# Patient Record
Sex: Male | Born: 1985 | Race: Black or African American | Hispanic: No | Marital: Single
Health system: Southern US, Community
[De-identification: ages and names within clinical notes are randomized; demographics above are authoritative.]

---

## 2019-01-06 ENCOUNTER — Other Ambulatory Visit: Payer: Self-pay

## 2019-01-06 DIAGNOSIS — Z20822 Contact with and (suspected) exposure to covid-19: Secondary | ICD-10-CM

## 2019-01-09 LAB — NOVEL CORONAVIRUS, NAA: SARS-CoV-2, NAA: NOT DETECTED

## 2020-06-12 ENCOUNTER — Emergency Department (HOSPITAL_COMMUNITY)
Admission: EM | Admit: 2020-06-12 | Discharge: 2020-06-12 | Disposition: A | Discharge status: Home / Self Care | Payer: Self-pay | Attending: Emergency Medicine | Admitting: Emergency Medicine

## 2020-06-12 ENCOUNTER — Emergency Department (HOSPITAL_COMMUNITY): Payer: Self-pay

## 2020-06-12 ENCOUNTER — Other Ambulatory Visit: Payer: Self-pay

## 2020-06-12 DIAGNOSIS — W1830XA Fall on same level, unspecified, initial encounter: Secondary | ICD-10-CM | POA: Insufficient documentation

## 2020-06-12 DIAGNOSIS — Z23 Encounter for immunization: Secondary | ICD-10-CM | POA: Insufficient documentation

## 2020-06-12 DIAGNOSIS — F10129 Alcohol abuse with intoxication, unspecified: Secondary | ICD-10-CM | POA: Insufficient documentation

## 2020-06-12 DIAGNOSIS — S0181XA Laceration without foreign body of other part of head, initial encounter: Secondary | ICD-10-CM | POA: Insufficient documentation

## 2020-06-12 DIAGNOSIS — F10920 Alcohol use, unspecified with intoxication, uncomplicated: Secondary | ICD-10-CM

## 2020-06-12 DIAGNOSIS — S161XXA Strain of muscle, fascia and tendon at neck level, initial encounter: Secondary | ICD-10-CM | POA: Insufficient documentation

## 2020-06-12 DIAGNOSIS — S060X1A Concussion with loss of consciousness of 30 minutes or less, initial encounter: Secondary | ICD-10-CM | POA: Insufficient documentation

## 2020-06-12 DIAGNOSIS — S0219XA Other fracture of base of skull, initial encounter for closed fracture: Secondary | ICD-10-CM

## 2020-06-12 DIAGNOSIS — S01512A Laceration without foreign body of oral cavity, initial encounter: Secondary | ICD-10-CM | POA: Insufficient documentation

## 2020-06-12 DIAGNOSIS — S0281XA Fracture of other specified skull and facial bones, right side, initial encounter for closed fracture: Secondary | ICD-10-CM | POA: Insufficient documentation

## 2020-06-12 MED ORDER — CIPROFLOXACIN-DEXAMETHASONE 0.3-0.1 % OT SUSP: OTIC | 0 refills | Status: AC

## 2020-06-12 MED ORDER — TETANUS-DIPHTH-ACELL PERTUSSIS 5-2.5-18.5 LF-MCG/0.5 IM SUSY
0.5000 mL | PREFILLED_SYRINGE | Freq: Once | INTRAMUSCULAR | Status: AC
  Administered 2020-06-12: 0.5 mL via INTRAMUSCULAR
  Filled 2020-06-12: qty 0.5

## 2020-06-12 MED ORDER — LIDOCAINE-EPINEPHRINE 1 %-1:100000 IJ SOLN
20.0000 mL | Freq: Once | INTRAMUSCULAR | Status: AC
  Administered 2020-06-12: 20 mL
  Filled 2020-06-12: qty 1
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
## 2020-06-12 NOTE — ED Notes (Signed)
Patient transported to CT 

## 2020-06-12 NOTE — ED Notes (Signed)
Pt refused temperature.

## 2020-06-12 NOTE — Discharge Instructions (Addendum)
Use the eardrops twice a day for 1 week.  Please see the ear specialist in 1 week.  You have a broken bone inside her ear which will cause some bleeding. If you have any fever over 100, worsening headache in the next 48 hours please return to the ER The stitches in your face will heal on their own and do not need to be removed
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
## 2020-06-12 NOTE — ED Notes (Signed)
Pt is now more awake and is oriented. Able to answer questions and hold a conversation. Reports living in [place] for a few months. Pt phone has no battery at this time and is charging. Pt is cleared by MD to go home. Cleared for discharge at this time.
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
## 2021-07-31 IMAGING — CT CT TEMPORAL BONES W/O CM
3 of 8 series · 15 of 40 positions shown, 17 images · non-contrast
Comparison: Head, face and cervical spine CT reported separately
today.

CLINICAL DATA: 34-year-old male status post fall, head and face
trauma. Possible bilateral temporal bone fractures on plain head CT.

EXAM:
CT TEMPORAL BONES WITHOUT CONTRAST
TECHNIQUE: Axial and coronal plane CT imaging of the petrous temporal bones was
performed with thin-collimation image reconstruction. No intravenous
contrast was administered. Multiplanar CT image reconstructions were
also generated.

[Series 4: temp bone bilat bone 0.6 uq77 · axial · 0.39mm/px · z∈[-162,-84]mm · 7 of 173 slices shown, 9 images]
[im 22/173  brain]
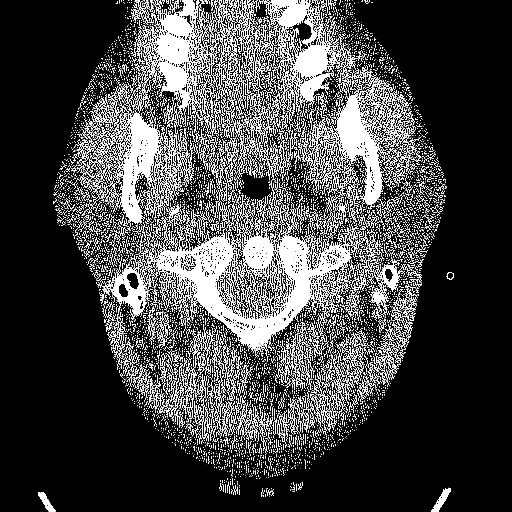
[im 22/173  bone]
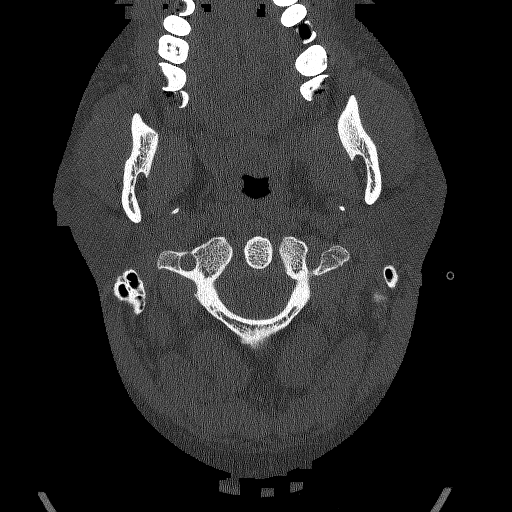
[im 44/173  bone]
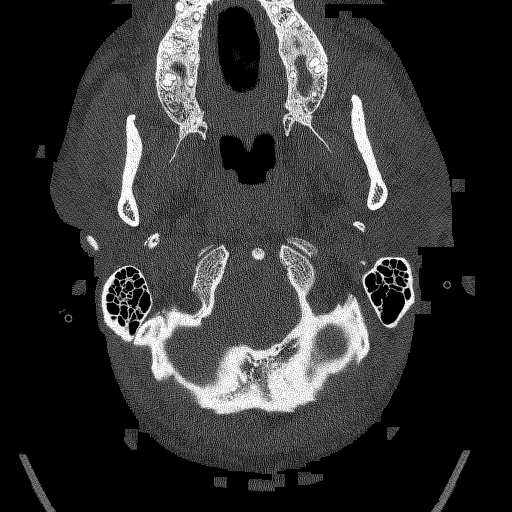
[im 65/173  bone]
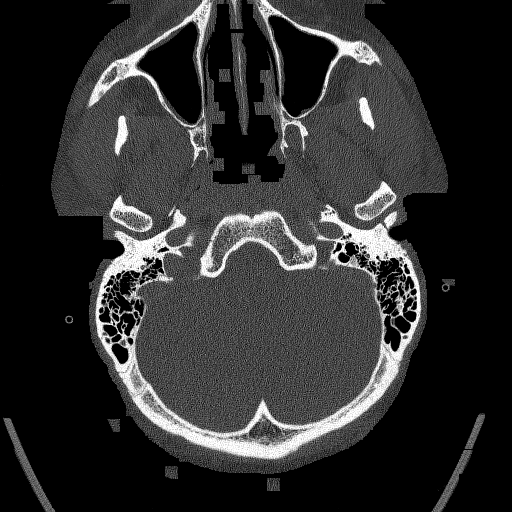
[im 87/173  bone]
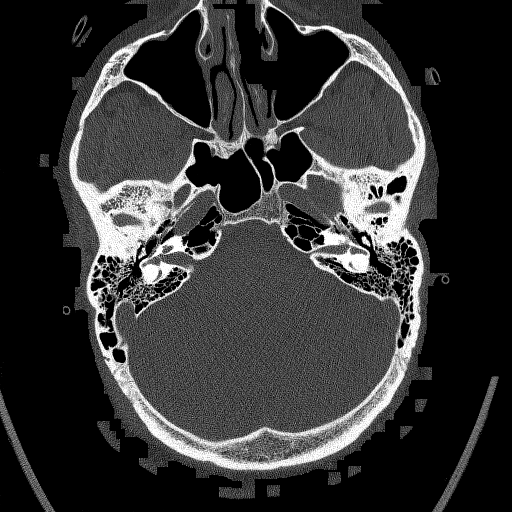
[im 108/173  brain]
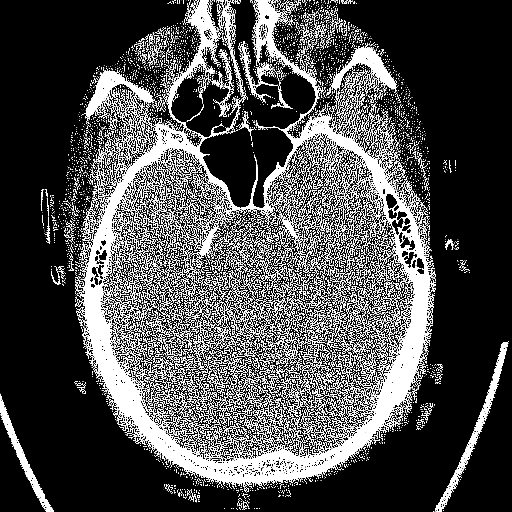
[im 108/173  bone]
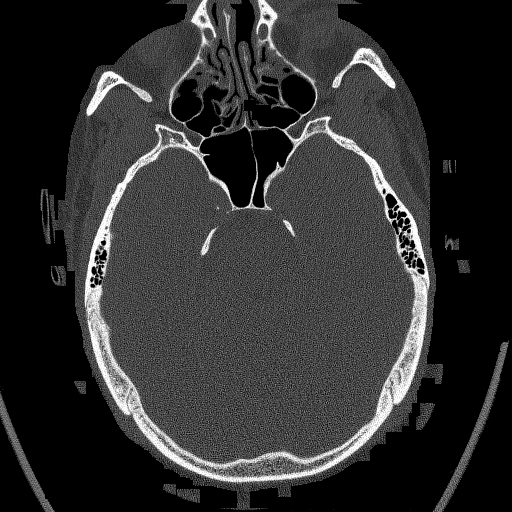
[im 130/173  bone]
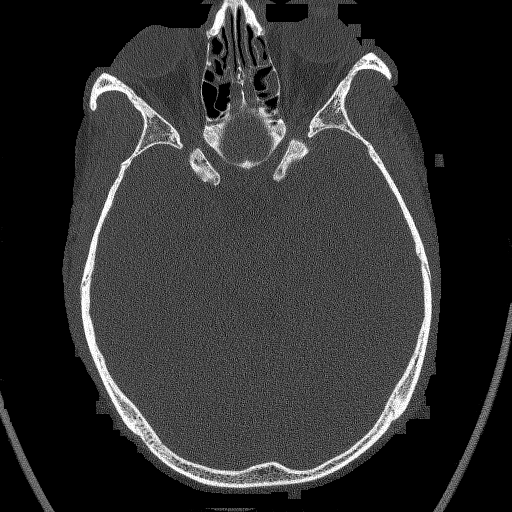
[im 151/173  bone]
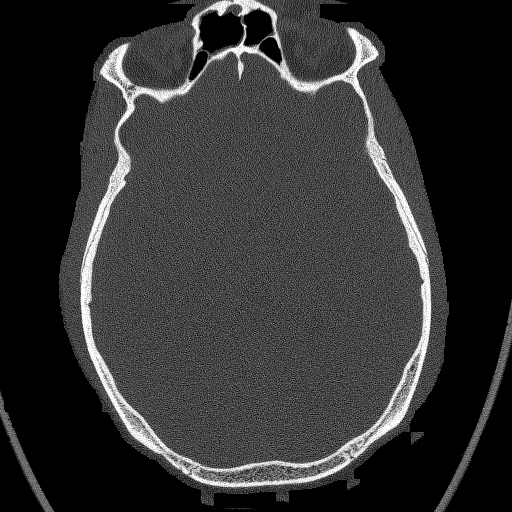

[Series 5: temp bone axial mag rt · axial · 0.28mm/px · z∈[-162,-84]mm · 7 of 173 slices shown]
[im 22/173  bone]
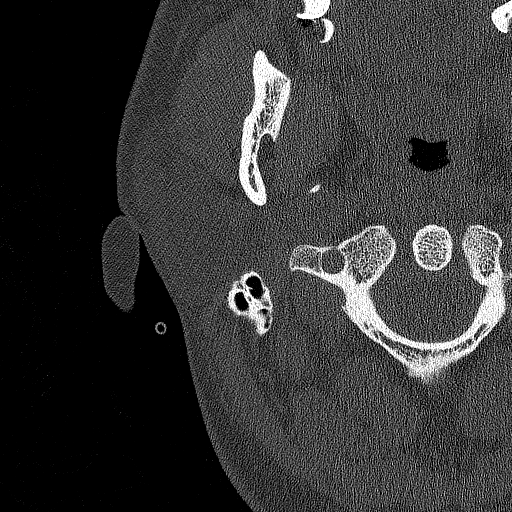
[im 44/173  bone]
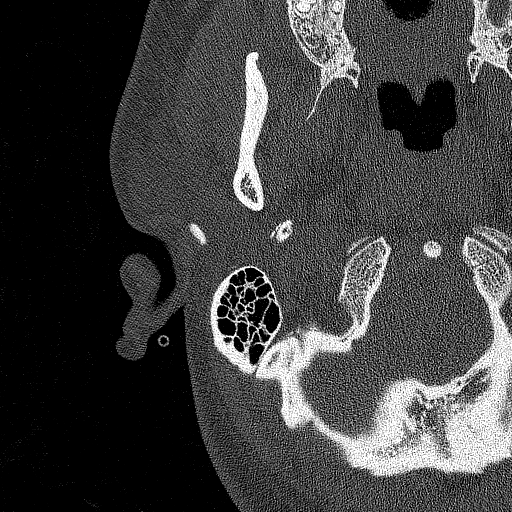
[im 65/173  bone]
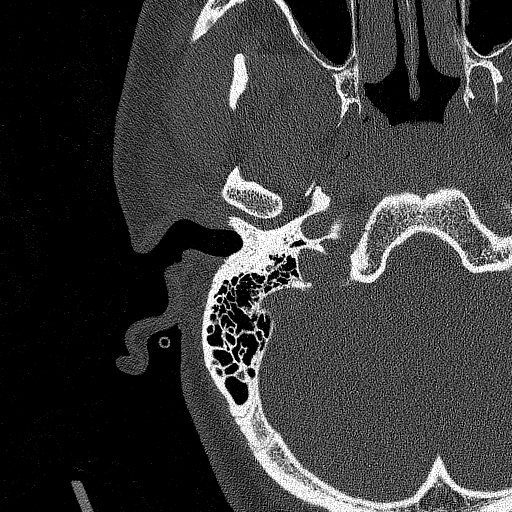
[im 87/173  bone]
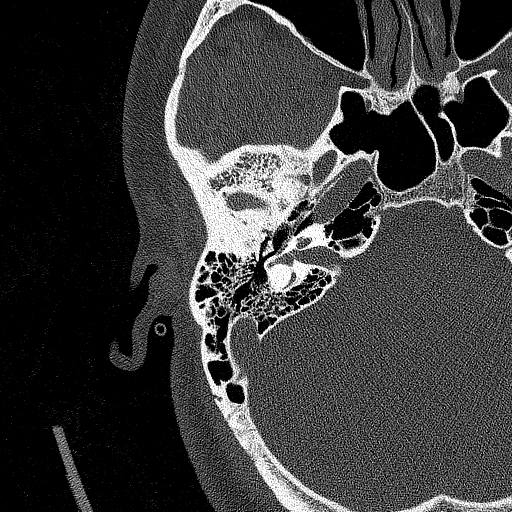
[im 108/173  bone]
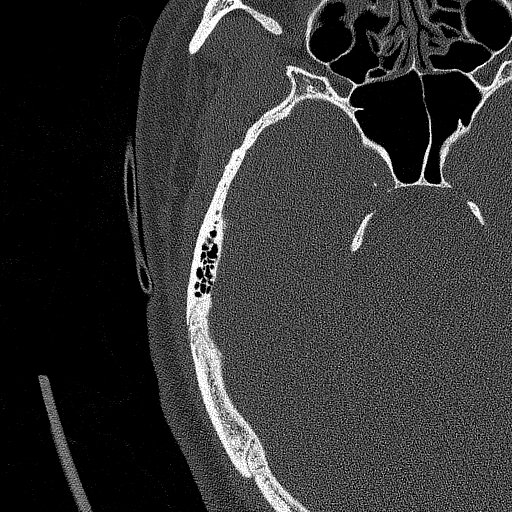
[im 130/173  bone]
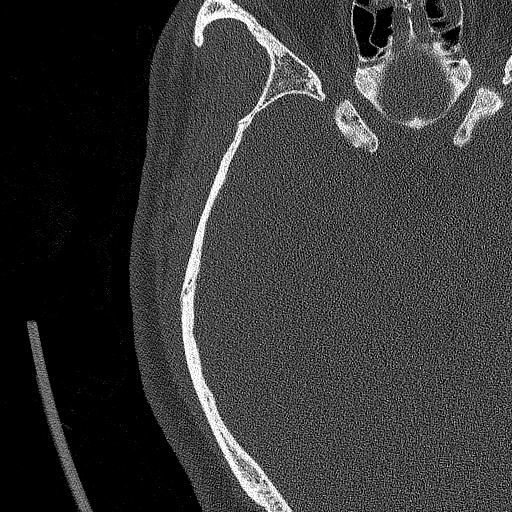
[im 151/173  bone]
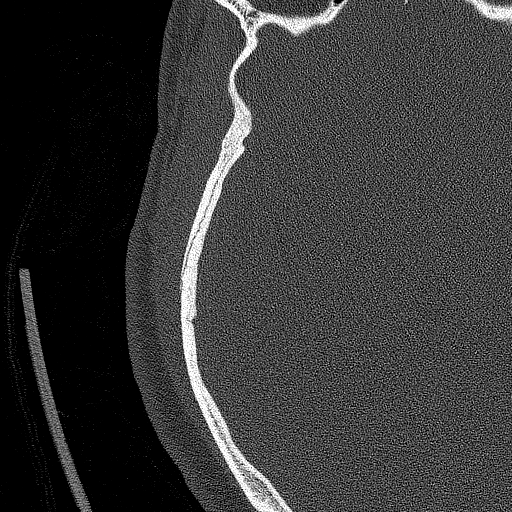

[Series 7: temp bone bilat coronal soft · coronal · 0.25mm/px · 1 of 392 slices shown]
[im 196/392  bone]
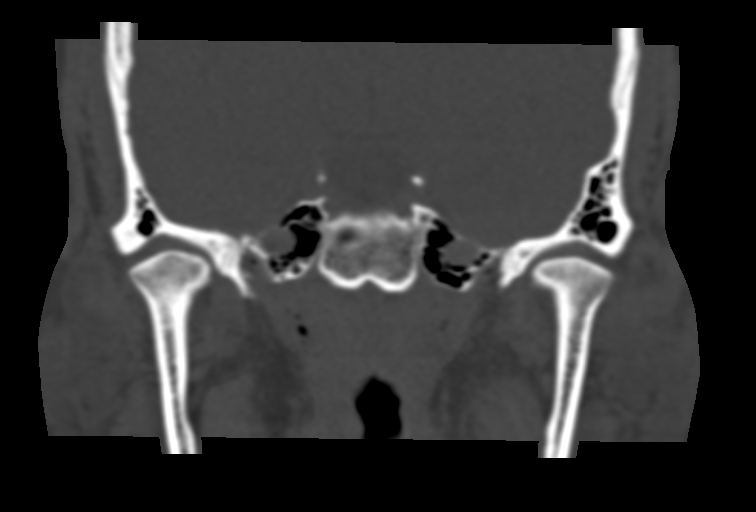

[15 of 40 positions shown; findings below may reference images not displayed]

FINDINGS: Negative visible noncontrast brain parenchyma. Negative visible
noncontrast deep soft tissue spaces of the face. Disconjugate gaze
but otherwise visualized orbit soft tissues are within normal
limits.

Carious dentition. Mild ethmoid, frontoethmoidal recess, maxillary
sinus mucosal thickening. No sinus fluid levels.

In general bone mineralization at the skull base is normal.

LEFT TEMPORAL BONE:

There is trace soft tissue gas immediately posterior and lateral to
the left TMJ (series 6, image 77), tracking cephalad (image 82). The
mandible condyle appears normally located. There articular fossa of
the left temporal bone appears intact, but there is bony
discontinuity along the left external auditory canal which does not
resemble typical cartilage calcification.

However, there is no obvious superficial soft tissue hematoma or
contusion in this region.

The left external auditory canal does remain patent but there is
soft tissue thickening or debris along the inferior canal at the
attachment of the tympanic membrane on series 10, image 167.

The left TM otherwise appears normal.

The left tympanic cavity is clear. The left scutum and ossicles
appear intact. Left mastoid antrum and air cells are clear.
Hyperplastic mastoid air cells and left petrous apex air cells.

Left IAC, cochlea, vestibule and vestibular aqueduct are within
normal limits. Left semicircular canals and course of the left 7th
nerve appear normal.

RIGHT TEMPORAL BONE:

Discontinuous calcification along the inferior right external
auditory canal is favored to be physiologic cartilage calcifications
rather than sequelae of trauma (series 5, image 55). No regional
periauricular soft tissue swelling or stranding is identified. The
right external auditory canal is patent.

Normal right tympanic membrane, tympanic cavity, scutum, ossicles.
Mastoid antrum and air cells are well aerated. Hyperplastic mastoids
and right petrous apex air cells.

Right IAC, cochlea, vestibule and vestibular aqueduct are within
normal limits. Right semicircular canals and course of the right 7th
nerve appear normal.
IMPRESSION: 1. Constellation suspicious for mildly comminuted fracture of bone
along the superficial left external auditory canal, immediately
posterior to but sparing the left TMJ.
Associated trace regional soft tissue gas, and trace debris in the
medial left EAC.

2. Left temporal bone and middle ear structures otherwise appear
intact, normal.

3. Right temporal bone within normal limits; suspect physiologic
cartilage calcifications at the superficial right EAC.
